# Patient Record
Sex: Female | Born: 1985 | Race: White | Hispanic: No | Marital: Single | Smoking: Former smoker
Health system: Southern US, Community
[De-identification: ages and names within clinical notes are randomized; demographics above are authoritative.]

## PROBLEM LIST (undated history)

## (undated) DIAGNOSIS — E079 Disorder of thyroid, unspecified: Secondary | ICD-10-CM

## (undated) DIAGNOSIS — R519 Headache, unspecified: Secondary | ICD-10-CM

## (undated) DIAGNOSIS — R51 Headache: Secondary | ICD-10-CM

## (undated) HISTORY — DX: Disorder of thyroid, unspecified: E07.9

## (undated) HISTORY — PX: THYROIDECTOMY: SHX17

## (undated) HISTORY — DX: Headache, unspecified: R51.9

## (undated) HISTORY — DX: Headache: R51

---
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
## 2003-06-02 ENCOUNTER — Other Ambulatory Visit: Admission: RE | Admit: 2003-06-02 | Discharge: 2003-06-02 | Payer: Self-pay | Admitting: Family Medicine

## 2016-10-19 ENCOUNTER — Encounter: Payer: Self-pay | Admitting: Endocrinology

## 2016-11-28 ENCOUNTER — Encounter: Payer: Self-pay | Admitting: Endocrinology

## 2017-02-22 ENCOUNTER — Encounter: Payer: Self-pay | Admitting: Endocrinology

## 2017-02-22 DIAGNOSIS — C73 Malignant neoplasm of thyroid gland: Secondary | ICD-10-CM | POA: Diagnosis not present

## 2017-02-23 ENCOUNTER — Other Ambulatory Visit: Payer: Self-pay | Admitting: Oncology

## 2017-02-23 DIAGNOSIS — C73 Malignant neoplasm of thyroid gland: Secondary | ICD-10-CM

## 2017-03-02 ENCOUNTER — Encounter: Payer: Self-pay | Admitting: Endocrinology

## 2017-03-02 ENCOUNTER — Ambulatory Visit (INDEPENDENT_AMBULATORY_CARE_PROVIDER_SITE_OTHER): Payer: BC Managed Care – PPO | Admitting: Endocrinology

## 2017-03-02 DIAGNOSIS — C73 Malignant neoplasm of thyroid gland: Secondary | ICD-10-CM

## 2017-03-02 DIAGNOSIS — E890 Postprocedural hypothyroidism: Secondary | ICD-10-CM | POA: Diagnosis not present

## 2017-03-02 MED ORDER — LEVOTHYROXINE SODIUM 125 MCG PO TABS: 125.0000 ug | ORAL_TABLET | Freq: Every day | ORAL | 3 refills | Status: AC

## 2017-03-02 NOTE — Patient Instructions (Addendum)
Please do the radioactive iodine pill as scheduled next week. I have sent a prescription to your pharmacy, to start the thyroid pill.  Start this after the radioactive iodine pill.  Some people need to start with 1/2 pill per day, due to symptoms of shakiness and heart racing.  I would be happy to see you back here as needed.

## 2017-03-02 NOTE — Progress Notes (Signed)
Subjective:    Patient ID: Tammy Mack, female    DOB: [birth year], 31 y.o.   MRN: [id number]  HPI  Pt is referred by Dr Hinton Rao, for f/u of thyroid cancer.  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 Left lobe had no malignancy.  She is on no thyroid medication now.  Neck swelling is much better. She has moderate cold intolerance, and assoc insomnia.  Past Medical History:  Diagnosis Date  . Thyroid disease     Past Surgical History:  Procedure Laterality Date  . THYROIDECTOMY Bilateral July 6th, August 3rd    Social History   Social History  . Marital status: Single    Spouse name: N/A  . Number of children: N/A  . Years of education: N/A   Occupational History  . Not on file.   Social History Main Topics  . Smoking status: Former Research scientist (life sciences)  . Smokeless tobacco: Never Used  . Alcohol use No  . Drug use: No  . Sexual activity: Not Currently    Birth control/ protection: Pill   Other Topics Concern  . Not on file   Social History Narrative  . No narrative on file    No current outpatient prescriptions on file prior to visit.   No current facility-administered medications on file prior to visit.     Allergies not on file  Family History  Problem Relation Age of Onset  . Thyroid disease Mother   . Cancer Sister     BP 132/80   Pulse 73   LMP 02/26/2017   SpO2 99%   Review of Systems denies depression, hair loss, muscle cramps, sob, weight gain, numbness, diplopia, myalgias, rhinorrhea, easy bruising, and syncope.  She has constipation,dry skin, and difficulty with concentration..      Objective:   Physical Exam VS: see vs page GEN: no distress HEAD: head: no deformity eyes: no periorbital swelling, no proptosis external nose and ears are normal mouth: no lesion seen NECK: a healed scar is present.  I do not appreciate a nodule in the thyroid or elsewhere in the neck.   CHEST WALL: no deformity LUNGS: clear to auscultation CV: reg rate and rhythm, no murmur ABD: abdomen is soft, nontender.  no hepatosplenomegaly.  not distended.  no hernia MUSCULOSKELETAL: muscle bulk and strength are grossly normal.  no obvious joint swelling.  gait is normal and steady EXTEMITIES: no deformity.  no ulcer on the feet.  feet are of normal color and temp.  no edema PULSES: dorsalis pedis intact bilat.  no carotid bruit NEURO:  cn 2-12 grossly intact.   readily moves all 4's.  sensation is intact to touch on the feet SKIN:  Normal texture and temperature.  No rash or suspicious lesion is visible.   NODES:  None palpable at the neck PSYCH: alert, well-oriented.  Does not appear anxious nor depressed.   I have reviewed outside records, and summarized: Pt was noted to have differentiated thyroid cancer, and referred here.  She was seen in surgical f/u.  Wound was healing well.    outside test results are reviewed: TSH=60 (02/22/17)    Assessment & Plan:  follicular variant of papillary cancer, new, stage 1.  We discussed RAI vs [place] f/u.  She chooses RAI (she has sched next week at [facility]).  We agreed she only needs low dose. Postsurgical hypothyroidism: adeq for RAI   Patient Instructions  Please do the radioactive iodine pill as scheduled next week. I have sent a prescription to your pharmacy, to start the thyroid pill.  Start this after the radioactive iodine pill.  Some people need to start with 1/2 pill per day, due to symptoms of shakiness and heart racing.  I would be happy to see you back here as needed.

## 2017-03-04 ENCOUNTER — Encounter: Payer: Self-pay | Admitting: Endocrinology

## 2017-03-04 DIAGNOSIS — C73 Malignant neoplasm of thyroid gland: Secondary | ICD-10-CM | POA: Insufficient documentation

## 2017-03-04 DIAGNOSIS — E039 Hypothyroidism, unspecified: Secondary | ICD-10-CM | POA: Insufficient documentation

## 2017-03-09 ENCOUNTER — Other Ambulatory Visit: Payer: Self-pay | Admitting: Endocrinology

## 2017-03-09 ENCOUNTER — Ambulatory Visit (HOSPITAL_COMMUNITY)
Admission: RE | Admit: 2017-03-09 | Discharge: 2017-03-09 | Disposition: A | Discharge status: Home / Self Care | Payer: BC Managed Care – PPO | Source: Ambulatory Visit | Attending: Oncology | Admitting: Oncology

## 2017-03-09 DIAGNOSIS — C73 Malignant neoplasm of thyroid gland: Secondary | ICD-10-CM

## 2017-03-10 ENCOUNTER — Other Ambulatory Visit: Payer: Self-pay | Admitting: Endocrinology

## 2017-03-10 ENCOUNTER — Encounter (HOSPITAL_COMMUNITY): Payer: BC Managed Care – PPO

## 2017-03-10 DIAGNOSIS — C73 Malignant neoplasm of thyroid gland: Secondary | ICD-10-CM

## 2017-03-13 ENCOUNTER — Telehealth: Payer: Self-pay | Admitting: Endocrinology

## 2017-03-13 NOTE — Telephone Encounter (Signed)
Patient called in reference to needing clarification on when to start taking levothyroxine (SYNTHROID, LEVOTHROID) 125 MCG tablet. Patient stated she is taking radiation pill on Thursday 9/13/1 and needs to know if she can take the levothyroxine Friday (the day after). Please call patient and advise. OK to leave message.

## 2017-03-14 NOTE — Telephone Encounter (Signed)
Yes, it is OK

## 2017-03-15 ENCOUNTER — Encounter: Payer: Self-pay | Admitting: Endocrinology

## 2017-03-15 NOTE — Telephone Encounter (Signed)
Called and left patient detailed VM that she could resume medication day after radiation pill.

## 2017-03-16 ENCOUNTER — Encounter (HOSPITAL_COMMUNITY)
Admission: RE | Admit: 2017-03-16 | Discharge: 2017-03-16 | Disposition: A | Discharge status: Home / Self Care | Payer: BC Managed Care – PPO | Source: Ambulatory Visit | Attending: Endocrinology | Admitting: Endocrinology

## 2017-03-16 DIAGNOSIS — C73 Malignant neoplasm of thyroid gland: Secondary | ICD-10-CM | POA: Insufficient documentation

## 2017-03-16 LAB — HCG, SERUM, QUALITATIVE: Preg, Serum: NEGATIVE

## 2017-03-16 MED ORDER — SODIUM IODIDE I 131 CAPSULE
29.3000 | Freq: Once | INTRAVENOUS | Status: AC | PRN
  Administered 2017-03-16: 29.3 via ORAL

## 2017-03-24 ENCOUNTER — Encounter (HOSPITAL_COMMUNITY)
Admission: RE | Admit: 2017-03-24 | Discharge: 2017-03-24 | Disposition: A | Discharge status: Home / Self Care | Payer: BC Managed Care – PPO | Source: Ambulatory Visit | Attending: Endocrinology | Admitting: Endocrinology

## 2017-03-24 DIAGNOSIS — C73 Malignant neoplasm of thyroid gland: Secondary | ICD-10-CM

## 2017-03-30 ENCOUNTER — Encounter: Payer: Self-pay | Admitting: Endocrinology

## 2017-05-22 DIAGNOSIS — E890 Postprocedural hypothyroidism: Secondary | ICD-10-CM | POA: Diagnosis not present

## 2017-05-22 DIAGNOSIS — C73 Malignant neoplasm of thyroid gland: Secondary | ICD-10-CM | POA: Diagnosis not present

## 2017-08-02 DIAGNOSIS — E890 Postprocedural hypothyroidism: Secondary | ICD-10-CM

## 2017-08-02 DIAGNOSIS — C73 Malignant neoplasm of thyroid gland: Secondary | ICD-10-CM | POA: Diagnosis not present

## 2018-01-31 DIAGNOSIS — C73 Malignant neoplasm of thyroid gland: Secondary | ICD-10-CM

## 2018-01-31 DIAGNOSIS — F419 Anxiety disorder, unspecified: Secondary | ICD-10-CM

## 2018-01-31 DIAGNOSIS — E890 Postprocedural hypothyroidism: Secondary | ICD-10-CM

## 2018-04-05 IMAGING — NM NM [ID] THYROID CANCER METS SP CA TX
6 series · 6 of 6 positions shown · non-contrast
Comparison: None.

CLINICAL DATA: Thyroid cancer with thyroidectomy in 3680. Status
post I 131 therapy with 31 millicuries.

EXAM:
NUCLEAR MEDICINE L-LRL POST THERAPY WHOLE BODY SCAN
TECHNIQUE: The patient received 31 mCi L-LRL sodium iodide for the treatment of
thyroid cancer within the past 10 days. The patient returns today,
and whole body scanning was performed in the anterior and posterior
projections.

[Series 1: marker · 4.14mm/px · 1 of 1 slices shown (1 of 2)]
[im 1/1]
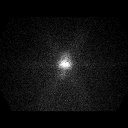

[Series 1: marker · 4.14mm/px · 1 of 1 slices shown (2 of 2)]
[im 1/1]
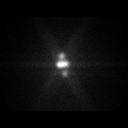

[Series 2: static thyroid no marker · 4.14mm/px · 1 of 1 slices shown (1 of 2)]
[im 1/1]
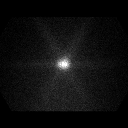

[Series 2: static thyroid no marker · 4.14mm/px · 1 of 1 slices shown (2 of 2)]
[im 1/1]
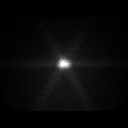

[Series 3: i131 whole body · 2.66mm/px · 1 of 1 slices shown (1 of 2)]
[im 1/1]
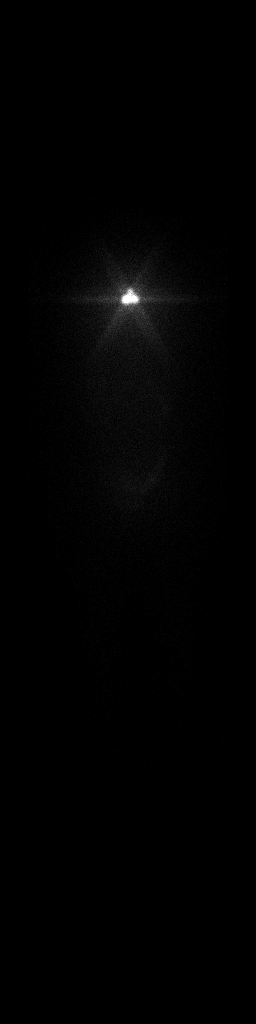

[Series 3: i131 whole body · 2.66mm/px · 1 of 1 slices shown (2 of 2)]
[im 1/1]
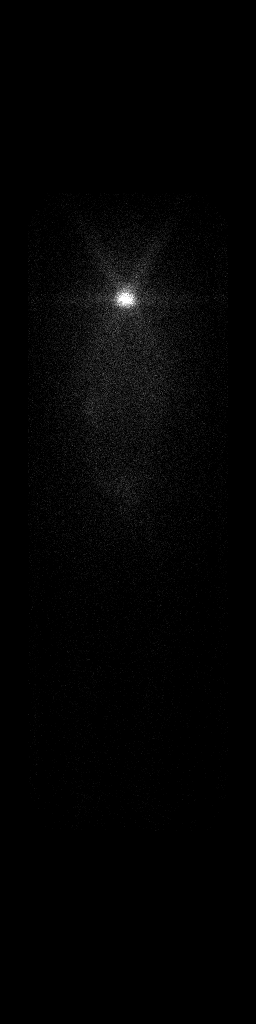

[6 of 6 positions shown; findings below may reference images not displayed]

FINDINGS: There is significant uptake in the thyroid bed. No other abnormal
uptake identified. No distant metastatic disease.
IMPRESSION: 1. Significant uptake in the neck is likely due to residual thyroid.
Evaluation for adjacent nodal metastases would be limited. However,
no distant metastases are identified.

## 2018-08-02 DIAGNOSIS — Z9089 Acquired absence of other organs: Secondary | ICD-10-CM | POA: Diagnosis not present

## 2018-08-02 DIAGNOSIS — Z79899 Other long term (current) drug therapy: Secondary | ICD-10-CM

## 2018-08-02 DIAGNOSIS — C73 Malignant neoplasm of thyroid gland: Secondary | ICD-10-CM | POA: Diagnosis not present

## 2018-08-02 DIAGNOSIS — E890 Postprocedural hypothyroidism: Secondary | ICD-10-CM | POA: Diagnosis not present
